# Patient Record
Sex: Male | Born: 2000 | Race: Black or African American | Hispanic: No | Marital: Single
Health system: Southern US, Community
[De-identification: ages and names within clinical notes are randomized; demographics above are authoritative.]

---

## 2020-08-16 ENCOUNTER — Other Ambulatory Visit: Payer: Self-pay

## 2021-01-23 ENCOUNTER — Encounter (HOSPITAL_COMMUNITY): Payer: Self-pay | Admitting: Emergency Medicine

## 2021-01-23 ENCOUNTER — Emergency Department (HOSPITAL_COMMUNITY): Payer: No Typology Code available for payment source

## 2021-01-23 ENCOUNTER — Other Ambulatory Visit: Payer: Self-pay

## 2021-01-23 ENCOUNTER — Emergency Department (HOSPITAL_COMMUNITY)
Admission: EM | Admit: 2021-01-23 | Discharge: 2021-01-23 | Disposition: A | Discharge status: Home / Self Care | Payer: No Typology Code available for payment source | Attending: Emergency Medicine | Admitting: Emergency Medicine

## 2021-01-23 DIAGNOSIS — M542 Cervicalgia: Secondary | ICD-10-CM | POA: Insufficient documentation

## 2021-01-23 DIAGNOSIS — Y92410 Unspecified street and highway as the place of occurrence of the external cause: Secondary | ICD-10-CM | POA: Diagnosis not present

## 2021-01-23 DIAGNOSIS — S161XXA Strain of muscle, fascia and tendon at neck level, initial encounter: Secondary | ICD-10-CM

## 2021-01-23 DIAGNOSIS — S39012A Strain of muscle, fascia and tendon of lower back, initial encounter: Secondary | ICD-10-CM

## 2021-01-23 DIAGNOSIS — M545 Low back pain, unspecified: Secondary | ICD-10-CM | POA: Diagnosis present

## 2021-01-23 MED ORDER — IBUPROFEN 200 MG PO TABS
400.0000 mg | ORAL_TABLET | Freq: Once | ORAL | Status: AC
  Administered 2021-01-23: 400 mg via ORAL
  Filled 2021-01-23: qty 2
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
## 2021-01-23 NOTE — Discharge Instructions (Signed)
It was our pleasure to provide your ER care today - we hope that you feel better.  Your xrays/ct scan look good. No acute injury is noted.   Take acetaminophen or ibuprofen as need.  Follow up with primary care doctor in 1-2 weeks if symptoms fail to improve/resolve.  Return to ER if worse, new symptoms, worsening/severe pain, numbness/weakness, or other concern.
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
